# Patient Record
Sex: Female | Born: 1995 | Hispanic: Yes | Marital: Married
Health system: Southern US, Community
[De-identification: ages and names within clinical notes are randomized; demographics above are authoritative.]

---

## 2021-07-19 ENCOUNTER — Emergency Department (HOSPITAL_COMMUNITY)
Admission: EM | Admit: 2021-07-19 | Discharge: 2021-07-19 | Disposition: A | Discharge status: Home / Self Care | Payer: Medicaid Other | Attending: Emergency Medicine | Admitting: Emergency Medicine

## 2021-07-19 ENCOUNTER — Encounter (HOSPITAL_COMMUNITY): Payer: Self-pay | Admitting: Radiology

## 2021-07-19 ENCOUNTER — Emergency Department (HOSPITAL_COMMUNITY): Payer: Medicaid Other

## 2021-07-19 ENCOUNTER — Other Ambulatory Visit: Payer: Self-pay

## 2021-07-19 DIAGNOSIS — R7309 Other abnormal glucose: Secondary | ICD-10-CM | POA: Diagnosis not present

## 2021-07-19 DIAGNOSIS — H539 Unspecified visual disturbance: Secondary | ICD-10-CM | POA: Insufficient documentation

## 2021-07-19 DIAGNOSIS — R519 Headache, unspecified: Secondary | ICD-10-CM

## 2021-07-19 LAB — CBG MONITORING, ED: Glucose-Capillary: 88 mg/dL (ref 70–99)

## 2021-07-19 IMAGING — MR MR HEAD W/O CM
10 of 11 series · 42 of 48 positions shown · non-contrast
Comparison: No pertinent prior exam.

CLINICAL DATA: Headache and blurry vision. Fourteen weeks pregnant.

EXAM:
MRI HEAD WITHOUT CONTRAST
MRV HEAD WITHOUT CONTRAST
TECHNIQUE: Multiplanar, multi-echo pulse sequences of the brain and surrounding
structures were acquired without intravenous contrast. Angiographic
images of the intracranial venous structures were acquired using MRV
technique without intravenous contrast.

[Series 5: DWI · axial · 3.0mm · 0.88mm/px · z∈[-133,+7]mm · 6 of 48 slices shown (1 of 4)]
[im 1/48]
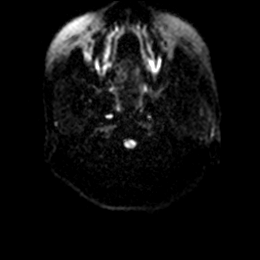
[im 10/48]
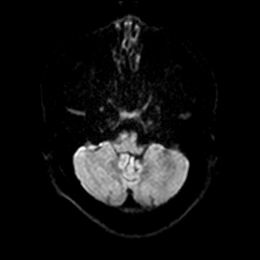
[im 19/48]
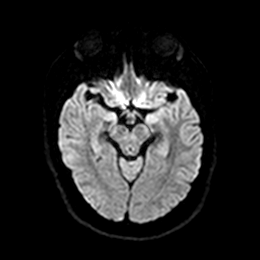
[im 29/48]
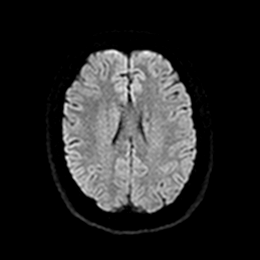
[im 38/48]
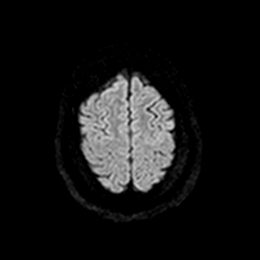
[im 48/48]
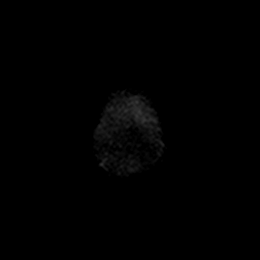

[Series 6: DWI · axial · 3.0mm · 0.88mm/px · z∈[-133,+7]mm · 5 of 48 slices shown (2 of 4)]
[im 1/48]
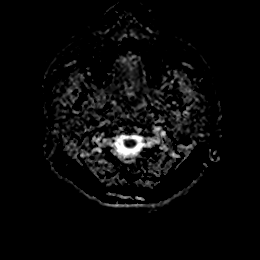
[im 12/48]
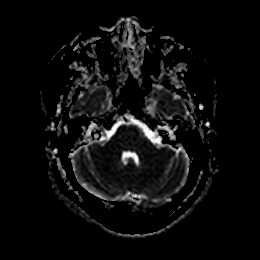
[im 24/48]
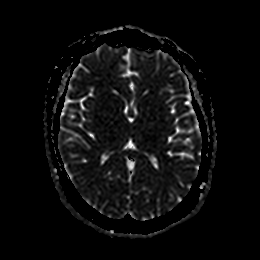
[im 36/48]
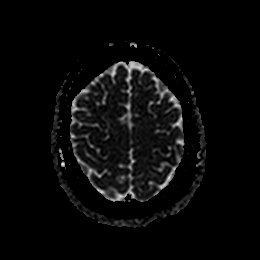
[im 48/48]
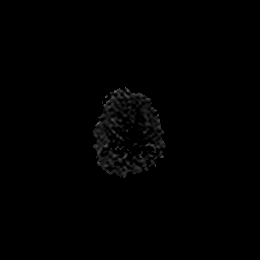

[Series 7: DWI · coronal · 4.0mm · 0.88mm/px · 4 of 35 slices shown (3 of 4)]
[im 1/35]
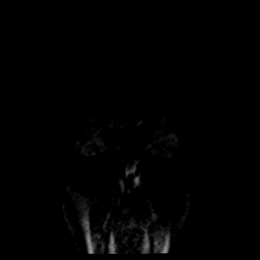
[im 12/35]
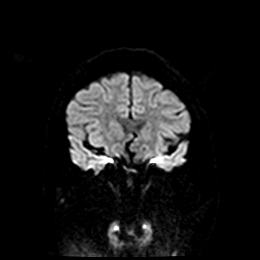
[im 23/35]
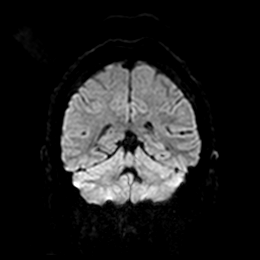
[im 35/35]
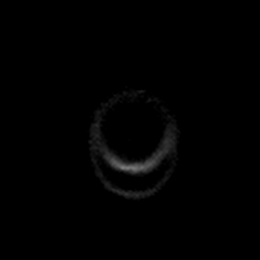

[Series 8: DWI · coronal · 4.0mm · 0.88mm/px · 4 of 35 slices shown (4 of 4)]
[im 1/35]
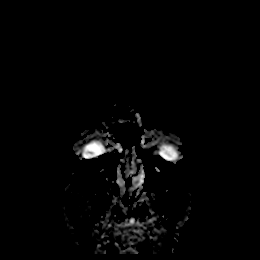
[im 12/35]
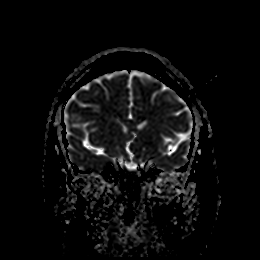
[im 23/35]
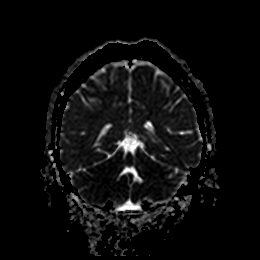
[im 35/35]
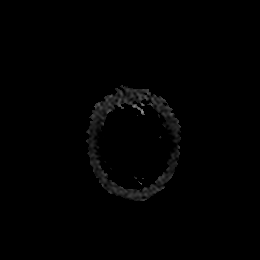

[Series 9: T1 · sagittal · 5.0mm · 0.75mm/px · 2 of 23 slices shown]
[im 1/23]
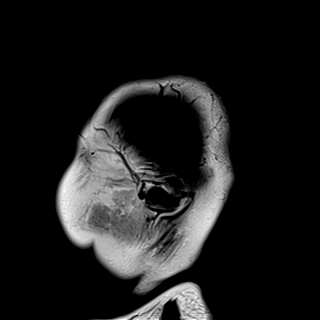
[im 23/23]
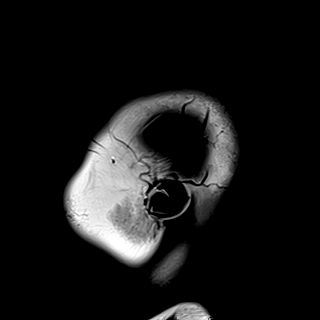

[Series 10: T2 · axial · 5.0mm · 0.72mm/px · z∈[-134,+8]mm · 3 of 25 slices shown (1 of 2)]
[im 1/25]
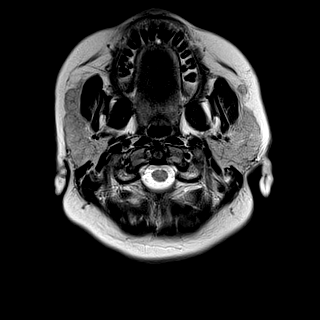
[im 13/25]
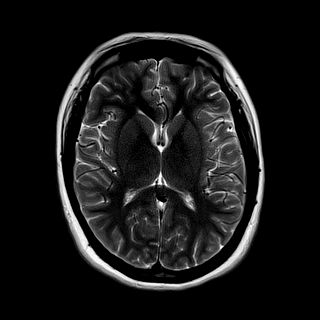
[im 25/25]
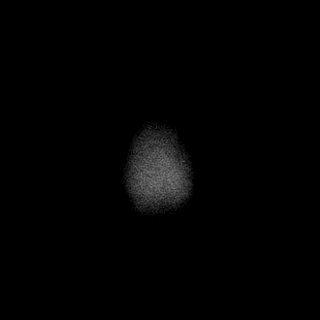

[Series 11: FLAIR · axial · 5.0mm · 0.45mm/px · z∈[-133,+10]mm · 3 of 25 slices shown]
[im 1/25]
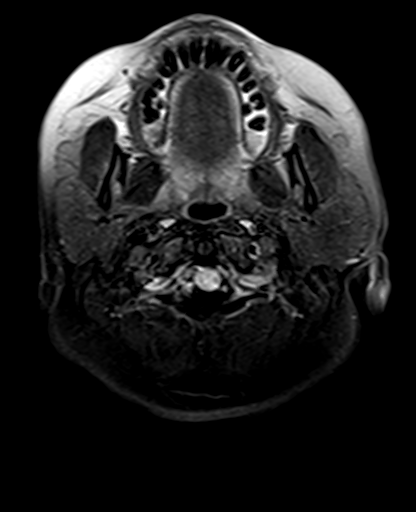
[im 13/25]
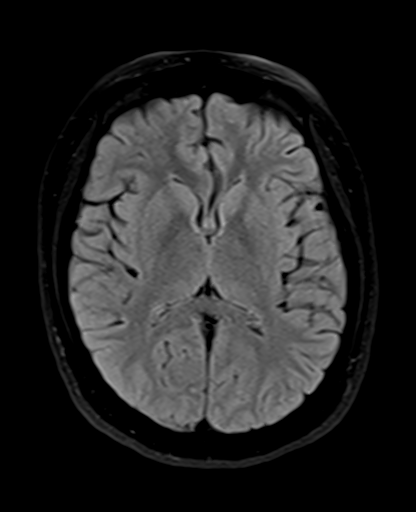
[im 25/25]
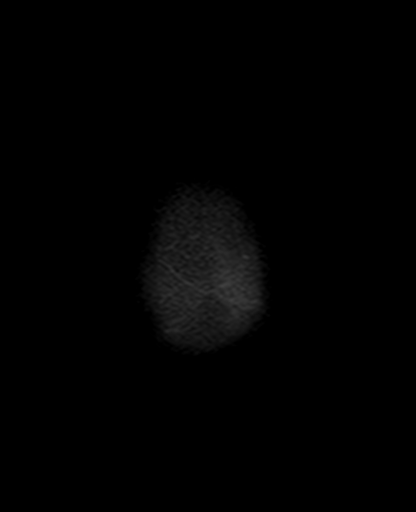

[Series 13: pha_images · axial · 3.0mm · 0.90mm/px · z∈[-149,+20]mm · 6 of 58 slices shown]
[im 1/58]
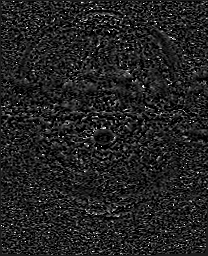
[im 12/58]
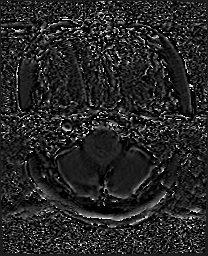
[im 23/58]
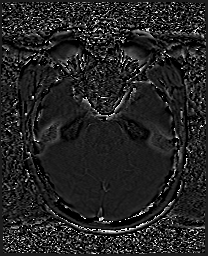
[im 35/58]
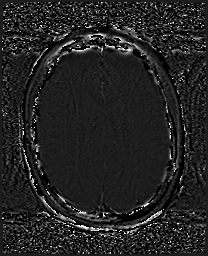
[im 46/58]
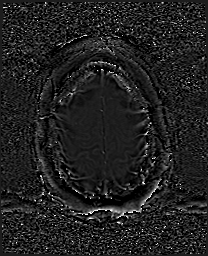
[im 58/58]
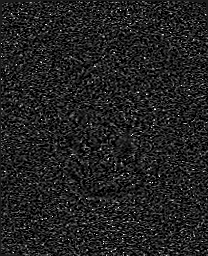

[Series 14: swi_images · axial · 3.0mm · 0.90mm/px · z∈[-149,+26]mm · 6 of 60 slices shown]
[im 1/60]
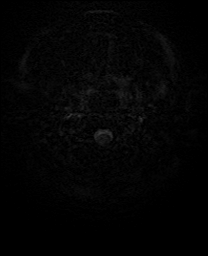
[im 12/60]
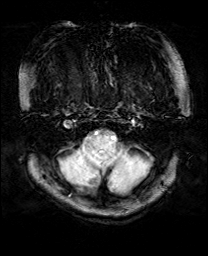
[im 24/60]
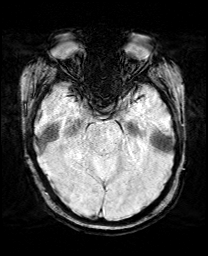
[im 36/60]
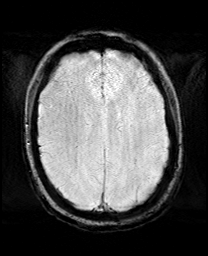
[im 48/60]
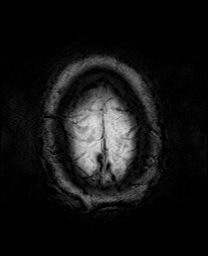
[im 60/60]
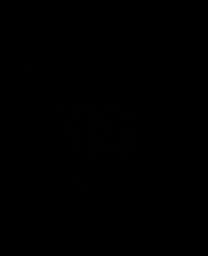

[Series 17: T2 · coronal · 5.0mm · 0.34mm/px · 3 of 29 slices shown (2 of 2)]
[im 1/29]
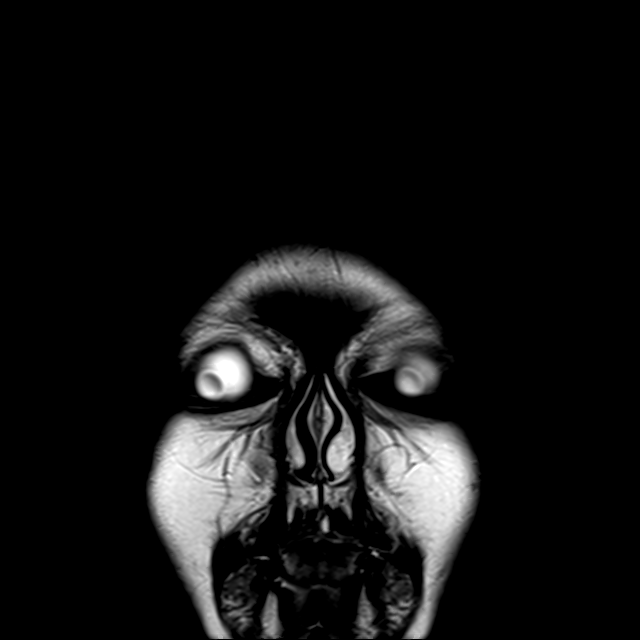
[im 15/29]
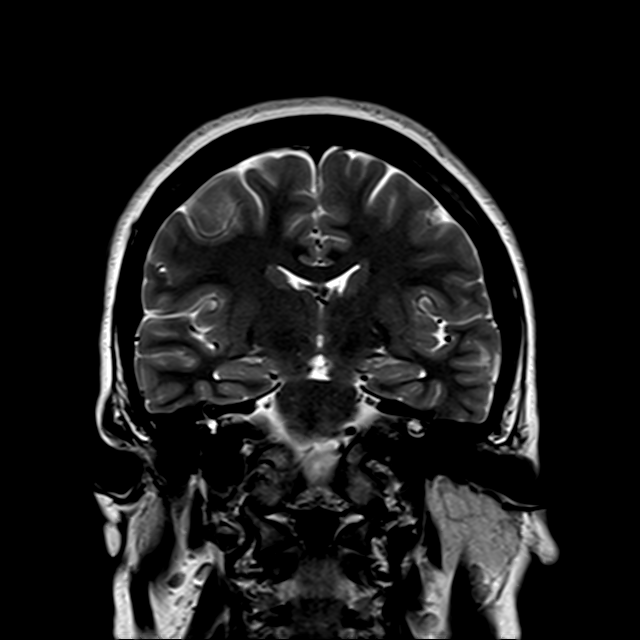
[im 29/29]
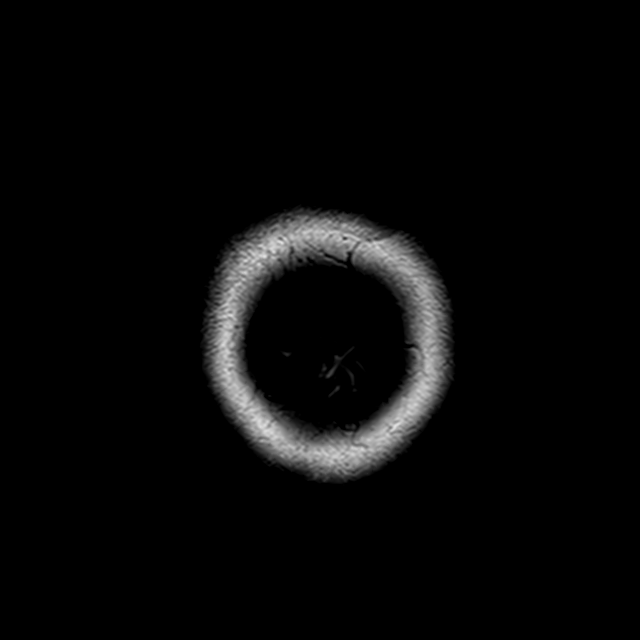

[42 of 48 positions shown; findings below may reference images not displayed]

FINDINGS: MRI HEAD WITHOUT CONTRAST

Brain: No acute infarct, mass effect or extra-axial collection. No
acute or chronic hemorrhage. There are 2 areas of focal
encephalomalacia in the left basal ganglia which may be chronic
small vessel infarcts or dilated perivascular spaces. There are no
lesions suggestive of demyelination. The midline structures are
normal.

Vascular: Major flow voids are preserved.

Skull and upper cervical spine: Normal calvarium and skull base.
Visualized upper cervical spine and soft tissues are normal.

Sinuses/Orbits:No paranasal sinus fluid levels or advanced mucosal
thickening. No mastoid or middle ear effusion. Normal orbits.

MR VENOGRAM WITHOUT CONTRAST

Superior sagittal sinus: Normal.

Straight sinus: Normal.

Inferior sagittal sinus, vein of LOVE PREET and internal cerebral veins:
Normal.

Transverse sinuses: Normal.

Sigmoid sinuses: Normal.

Visualized jugular veins: Normal.
IMPRESSION: 1. No acute intracranial abnormality or evidence of demyelinating
disease.
2. Normal MRV of the brain.
3. Old small vessel infarcts (favored) vs dilated perivascular
spaces of the left basal ganglia.

## 2021-07-19 IMAGING — MR MR [PERSON_NAME] HEAD
3 series · 41 of 48 positions shown · non-contrast
Comparison: No pertinent prior exam.

CLINICAL DATA: Headache and blurry vision. Fourteen weeks pregnant.

EXAM:
MRI HEAD WITHOUT CONTRAST
MRV HEAD WITHOUT CONTRAST
TECHNIQUE: Multiplanar, multi-echo pulse sequences of the brain and surrounding
structures were acquired without intravenous contrast. Angiographic
images of the intracranial venous structures were acquired using MRV
technique without intravenous contrast.

[Series 5: tof_fl2d_paracor · coronal · 2.0mm · 0.98mm/px · 12 of 132 slices shown]
[im 1/132]
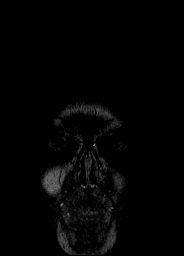
[im 12/132]
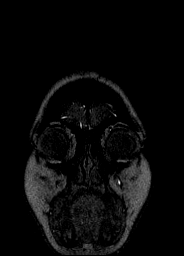
[im 24/132]
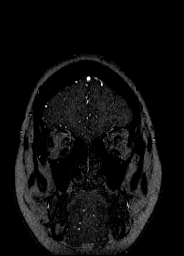
[im 36/132]
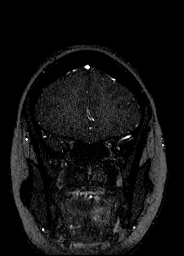
[im 48/132]
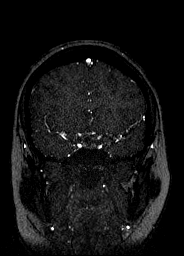
[im 60/132]
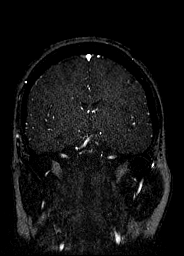
[im 72/132]
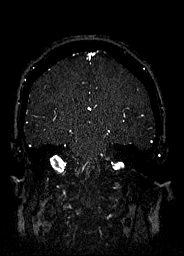
[im 84/132]
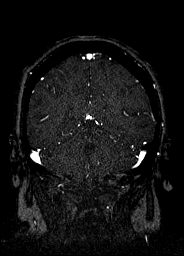
[im 96/132]
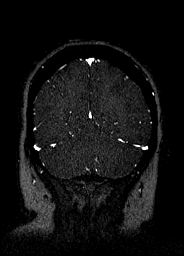
[im 108/132]
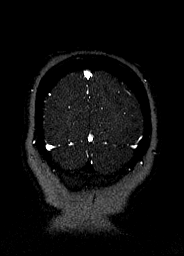
[im 120/132]
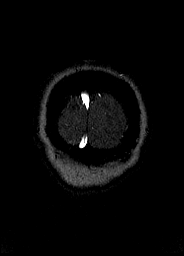
[im 132/132]
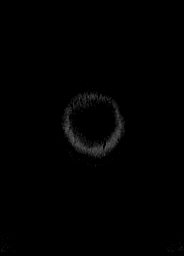

[Series 9: venous inhance coronal · coronal · portal-venous · 0.9mm · 0.57mm/px · 18 of 208 slices shown]
[im 1/208]
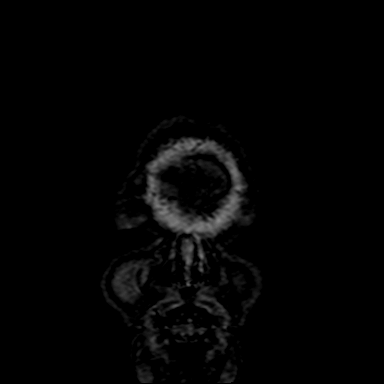
[im 13/208]
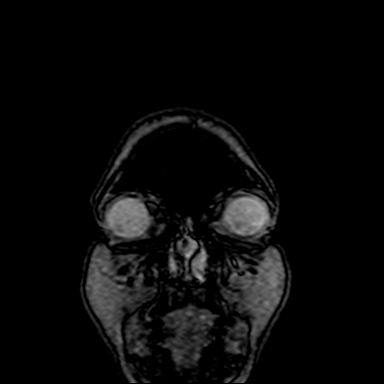
[im 25/208]
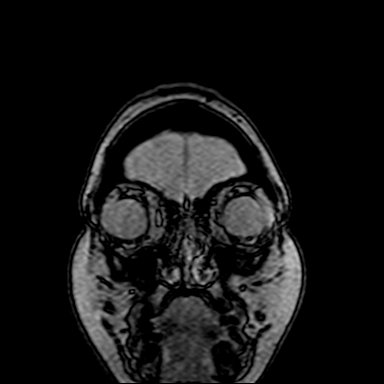
[im 37/208]
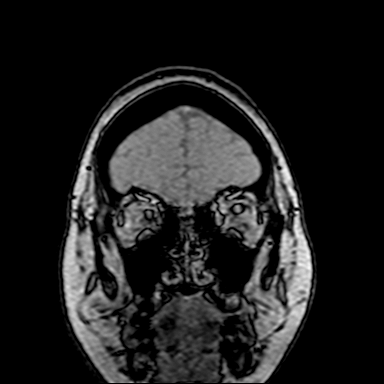
[im 49/208]
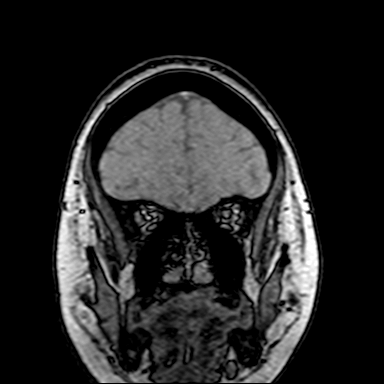
[im 61/208]
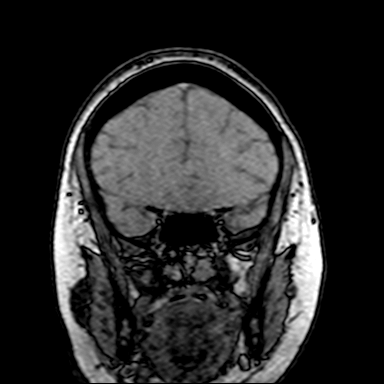
[im 74/208]
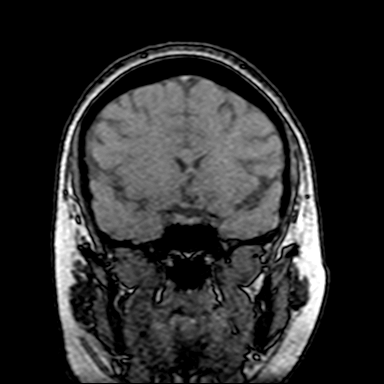
[im 86/208]
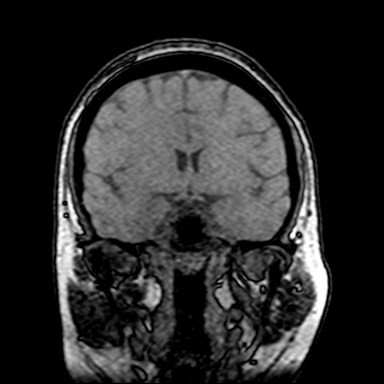
[im 98/208]
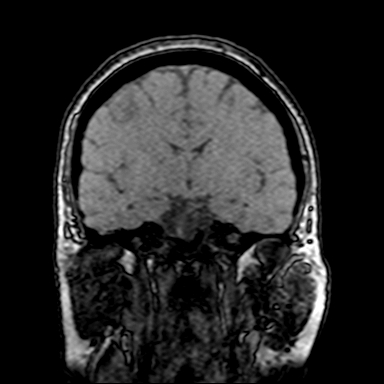
[im 110/208]
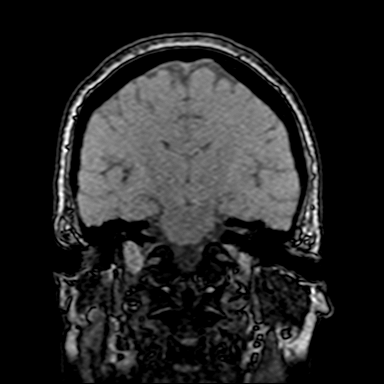
[im 122/208]
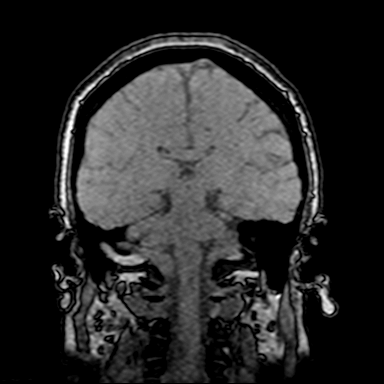
[im 134/208]
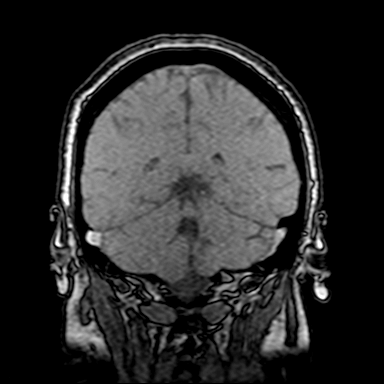
[im 147/208]
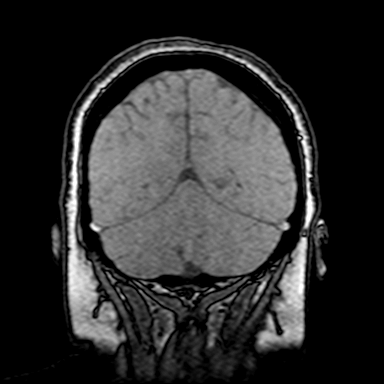
[im 159/208]
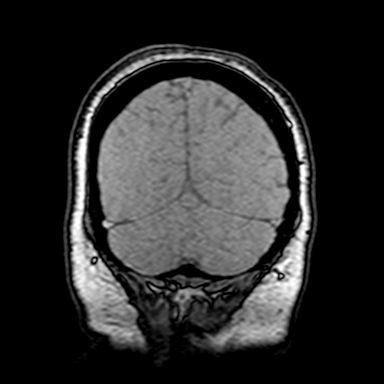
[im 171/208]
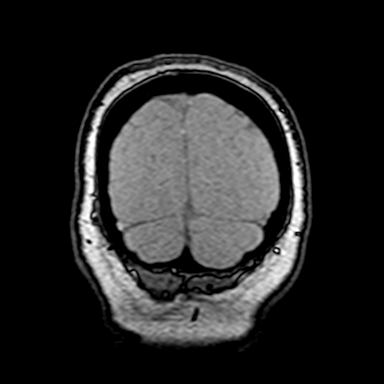
[im 183/208]
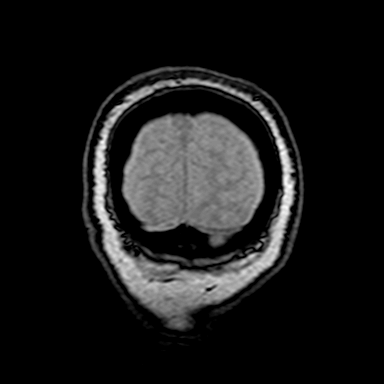
[im 195/208]
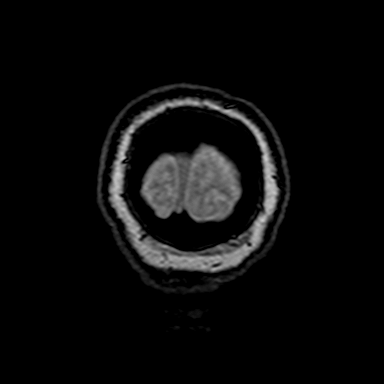
[im 208/208]
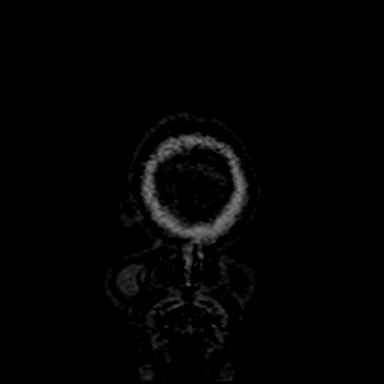

[Series 10: venous inhance coronal_msum · coronal · portal-venous · 0.9mm · 0.57mm/px · 11 of 208 slices shown]
[im 1/208]
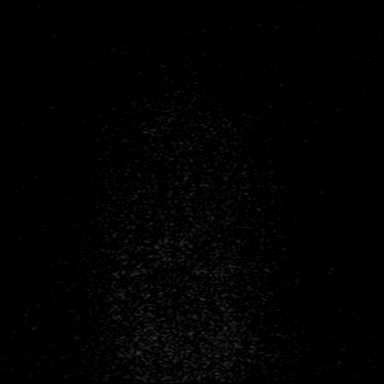
[im 13/208]
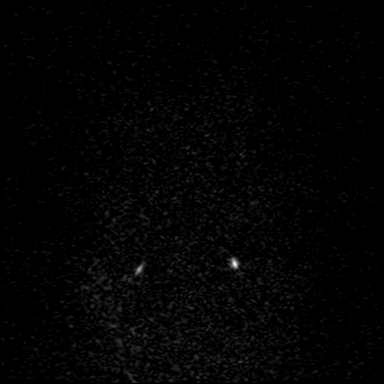
[im 37/208]
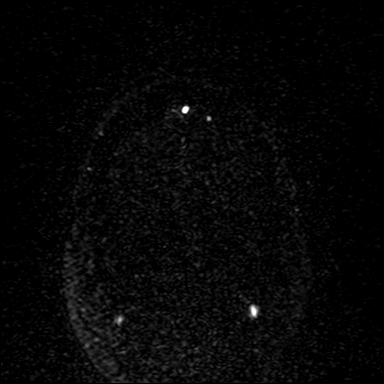
[im 61/208]
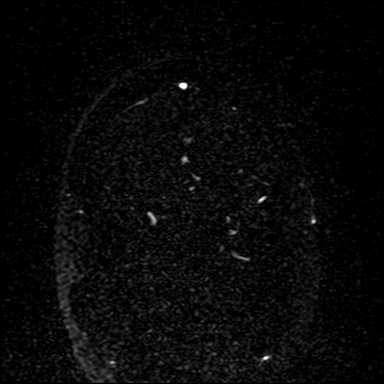
[im 86/208]
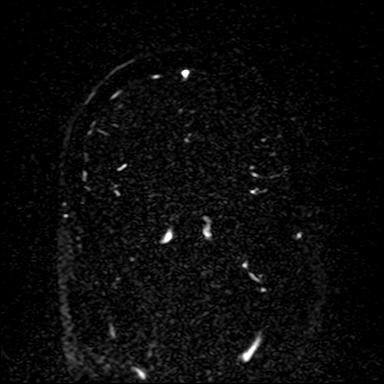
[im 110/208]
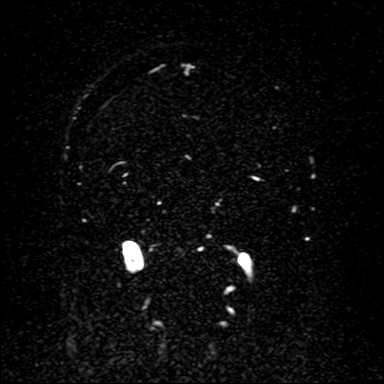
[im 122/208]
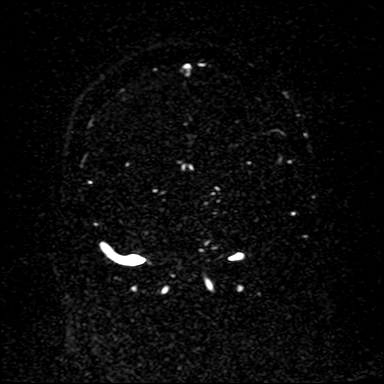
[im 147/208]
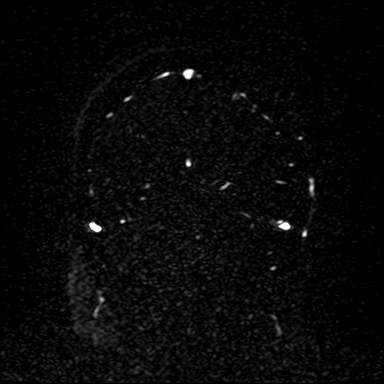
[im 171/208]
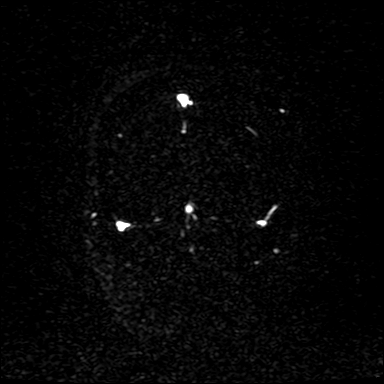
[im 183/208]
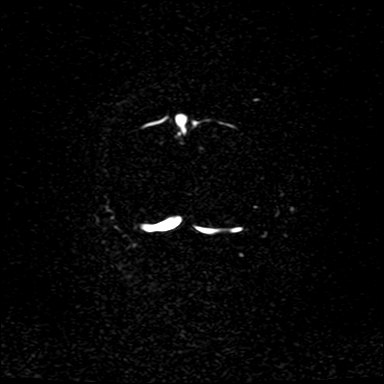
[im 195/208]
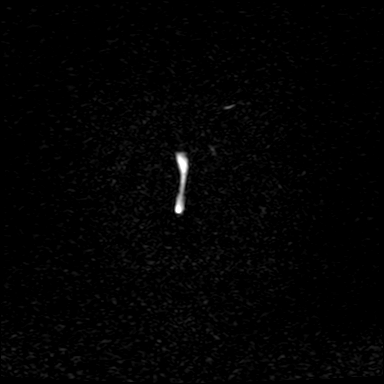

[41 of 48 positions shown; findings below may reference images not displayed]

FINDINGS: MRI HEAD WITHOUT CONTRAST

Brain: No acute infarct, mass effect or extra-axial collection. No
acute or chronic hemorrhage. There are 2 areas of focal
encephalomalacia in the left basal ganglia which may be chronic
small vessel infarcts or dilated perivascular spaces. There are no
lesions suggestive of demyelination. The midline structures are
normal.

Vascular: Major flow voids are preserved.

Skull and upper cervical spine: Normal calvarium and skull base.
Visualized upper cervical spine and soft tissues are normal.

Sinuses/Orbits:No paranasal sinus fluid levels or advanced mucosal
thickening. No mastoid or middle ear effusion. Normal orbits.

MR VENOGRAM WITHOUT CONTRAST

Superior sagittal sinus: Normal.

Straight sinus: Normal.

Inferior sagittal sinus, vein of LOVE PREET and internal cerebral veins:
Normal.

Transverse sinuses: Normal.

Sigmoid sinuses: Normal.

Visualized jugular veins: Normal.
IMPRESSION: 1. No acute intracranial abnormality or evidence of demyelinating
disease.
2. Normal MRV of the brain.
3. Old small vessel infarcts (favored) vs dilated perivascular
spaces of the left basal ganglia.

## 2021-07-19 NOTE — ED Triage Notes (Addendum)
BIB [facility] transfer from [facility] for MRI. Migraine symptoms since 4/11 with HA and progressed to blurred vision (4/21 early morning). Blurred vision recently resolved 2000 4/22. [redacted] weeks pregnant, gestational diabetes. Was looking into possible MS diagnostics with PCP in past. ?

## 2021-07-19 NOTE — ED Notes (Signed)
Pt verbalized understanding of d/c instructions, meds, and followup care. Denies questions. VSS, no distress noted. Steady gait to exit with all belongings.  ?

## 2021-07-19 NOTE — ED Provider Notes (Signed)
?[facility] ?Provider Note ? ? ?CSN: [id number] ?Arrival date & time: 07/19/21  2100 ? ?  ? ?History ? ?Chief Complaint  ?Patient presents with  ? Eye Problem  ?  Blurred vision  ? ? ?Veronica Morrow is a 26 y.o. female. ? ?HPI ?Patient presents as a transfer from another facility, accepted by prior care teams due to concern for headache, vision changes in the context of known pregnancy.  Patient is G2, P1 about 14 weeks.  She has a history of migraines, occasional with visual disturbance and notes that she had that about 24 hours ago.  Prior to this ED arrival patient's vision returned to baseline, headache resolved as well.  No weakness in her extremities. ?  ? ?Home Medications ?Prior to Admission medications   ?Not on File  ?   ? ?Allergies    ?Patient has no known allergies.   ? ?Review of Systems   ?Review of Systems  ?Constitutional:   ?     Per HPI, otherwise negative  ?HENT:    ?     Per HPI, otherwise negative  ?Respiratory:    ?     Per HPI, otherwise negative  ?Cardiovascular:   ?     Per HPI, otherwise negative  ?Gastrointestinal:  Negative for vomiting.  ?Endocrine:  ?     Negative aside from HPI  ?Genitourinary:   ?     Neg aside from HPI   ?Musculoskeletal:   ?     Per HPI, otherwise negative  ?Skin: Negative.   ?Neurological:  Positive for headaches. Negative for syncope.  ? ?Physical Exam ?Updated Vital Signs ?BP 102/83   Pulse 77   Resp (!) 24   Ht 5\' 3"  (1.6 m)   Wt 88.9 kg   SpO2 99%   BMI 34.72 kg/m?  ?Physical Exam ?Vitals and nursing note reviewed.  ?Constitutional:   ?   General: She is not in acute distress. ?   Appearance: She is well-developed.  ?HENT:  ?   Head: Normocephalic and atraumatic.  ?Eyes:  ?   Conjunctiva/sclera: Conjunctivae normal.  ?Cardiovascular:  ?   Rate and Rhythm: Normal rate and regular rhythm.  ?Pulmonary:  ?   Effort: Pulmonary effort is normal. No respiratory distress.  ?   Breath sounds: Normal breath sounds. No stridor.   ?Abdominal:  ?   General: There is no distension.  ?Skin: ?   General: Skin is warm and dry.  ?Neurological:  ?   General: No focal deficit present.  ?   Mental Status: She is alert and oriented to person, place, and time.  ?   Cranial Nerves: No cranial nerve deficit.  ?   Motor: No weakness.  ?Psychiatric:     ?   Mood and Affect: Mood normal.  ? ? ?ED Results / Procedures / Treatments   ?Labs ?(all labs ordered are listed, but only abnormal results are displayed) ?Labs Reviewed  ?CBG MONITORING, ED  ? ? ?EKG ?None ? ?Radiology ?MR BRAIN WO CONTRAST ? ?Result Date: 07/19/2021 ?CLINICAL DATA:  Headache and blurry vision. Fourteen weeks pregnant. EXAM: MRI HEAD WITHOUT CONTRAST MRV HEAD WITHOUT CONTRAST TECHNIQUE: Multiplanar, multi-echo pulse sequences of the brain and surrounding structures were acquired without intravenous contrast. Angiographic images of the intracranial venous structures were acquired using MRV technique without intravenous contrast. COMPARISON:  No pertinent prior exam. FINDINGS: MRI HEAD WITHOUT CONTRAST Brain: No acute infarct, mass effect or extra-axial collection.  No acute or chronic hemorrhage. There are 2 areas of focal encephalomalacia in the left basal ganglia which may be chronic small vessel infarcts or dilated perivascular spaces. There are no lesions suggestive of demyelination. The midline structures are normal. Vascular: Major flow voids are preserved. Skull and upper cervical spine: Normal calvarium and skull base. Visualized upper cervical spine and soft tissues are normal. Sinuses/Orbits:No paranasal sinus fluid levels or advanced mucosal thickening. No mastoid or middle ear effusion. Normal orbits. MR VENOGRAM WITHOUT CONTRAST Superior sagittal sinus: Normal. Straight sinus: Normal. Inferior sagittal sinus, vein of Galen and internal cerebral veins: Normal. Transverse sinuses: Normal. Sigmoid sinuses: Normal. Visualized jugular veins: Normal. IMPRESSION: 1. No acute  intracranial abnormality or evidence of demyelinating disease. 2. Normal MRV of the brain. 3. 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 Electronically Signed   By: Deatra Robinson M.D.   On: 07/19/2021 23:22  ? ?MR Venogram Head ? ?Result Date: 07/19/2021 ?CLINICAL DATA:  Headache and blurry vision. Fourteen weeks pregnant. EXAM: MRI HEAD WITHOUT CONTRAST MRV HEAD WITHOUT CONTRAST TECHNIQUE: Multiplanar, multi-echo pulse sequences of the brain and surrounding structures were acquired without intravenous contrast. Angiographic images of the intracranial venous structures were acquired using MRV technique without intravenous contrast. COMPARISON:  No pertinent prior exam. FINDINGS: MRI HEAD WITHOUT CONTRAST Brain: No acute infarct, mass effect or extra-axial collection. No acute or chronic hemorrhage. There are 2 areas of focal encephalomalacia in the left basal ganglia which may be chronic small vessel infarcts or dilated perivascular spaces. There are no lesions suggestive of demyelination. The midline structures are normal. Vascular: Major flow voids are preserved. Skull and upper cervical spine: Normal calvarium and skull base. Visualized upper cervical spine and soft tissues are normal. Sinuses/Orbits:No paranasal sinus fluid levels or advanced mucosal thickening. No mastoid or middle ear effusion. Normal orbits. MR VENOGRAM WITHOUT CONTRAST Superior sagittal sinus: Normal. Straight sinus: Normal. Inferior sagittal sinus, vein of Galen and internal cerebral veins: Normal. Transverse sinuses: Normal. Sigmoid sinuses: Normal. Visualized jugular veins: Normal. IMPRESSION: 1. No acute intracranial abnormality or evidence of demyelinating disease. 2. Normal MRV of the brain. 3. 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 Electronically Signed   By: Deatra Robinson M.D.   On: 07/19/2021 23:22   ? ?Procedures ?Procedures   ? ? ?Medications Ordered in ED ?Medications - No data to display ? ?ED Course/ Medical Decision Making/ A&P ?This patient with a Hx of migraines in pregnancy presents to the ED for concern of headache, vision changes, this involves an extensive number of treatment options, and is a complaint that carries with it a high risk of complications and morbidity.   ? ?The differential diagnosis includes intracranial abnormality, MS, mass ? ? ?Social Determinants of Health: ? ?No limits ? ?Additional history obtained: ? ?Additional history and/or information obtained from husband at bedside, notable for details of HPI ? ? ?After the initial evaluation, orders, including: MRI were initiated. ? ? ?Patient placed on Cardiac and Pulse-Oximetry Monitors. ?The patient was maintained on a cardiac monitor.  The cardiac monitored showed an rhythm of 80 sinus normal ?The patient was also maintained on pulse oximetry. The readings were typically 100% room air normal ? ? ? ?Imaging Studies ordered: ? ?I independently visualized and interpreted imaging which showed no notable abnormalities ?I agree with the radiologist interpretation ? ?Consultations Obtained: ? ?I requested consultation with the neurology,  and discussed lab  and imaging findings as well as pertinent plan - they recommend: Discharge with outpatient follow-up ? ?Dispostion / Final MDM: ? ?After consideration of the diagnostic results and the patient's response to treatment, this adult female with history of migraines, prior vision deficits was presenting after a longer headache with vision changes than usual.  Some suspicion for MS given outside hospital report, symptoms.  Patient's evaluation here reassuring, patient comfortable with discharge with outpatient follow-up. ? ?Final Clinical Impression(s) / ED Diagnoses ?Final diagnoses:  ?Bad headache  ?Visual disturbance  ? ? ?Rx / DC Orders ?ED Discharge Orders   ? ? None  ? ?  ? ? ?  ?Gerhard Munch, MD ?07/20/21  2333 ? ?

## 2021-07-19 NOTE — Discharge Instructions (Signed)
As discussed, your evaluation today has been largely reassuring.  But, it is important that you monitor your condition carefully, and do not hesitate to return to the ED if you develop new, or concerning changes in your condition. ? ?Otherwise, please follow-up with your physician for appropriate ongoing care. ? ?

## 2021-07-19 NOTE — ED Notes (Signed)
Patient transported to MRI
